# Patient Record
Sex: Male | Born: 1985 | Race: Black or African American | Hispanic: No | Marital: Single | Smoking: Former smoker
Health system: Southern US, Community
[De-identification: ages and names within clinical notes are randomized; demographics above are authoritative.]

## PROBLEM LIST (undated history)

## (undated) DIAGNOSIS — I10 Essential (primary) hypertension: Secondary | ICD-10-CM

## (undated) HISTORY — PX: TESTICLE SURGERY: SHX794

---

## 2011-11-20 ENCOUNTER — Emergency Department (HOSPITAL_BASED_OUTPATIENT_CLINIC_OR_DEPARTMENT_OTHER)
Admission: EM | Admit: 2011-11-20 | Discharge: 2011-11-20 | Disposition: A | Discharge status: Home / Self Care | Payer: No Typology Code available for payment source | Attending: Emergency Medicine | Admitting: Emergency Medicine

## 2011-11-20 ENCOUNTER — Encounter (HOSPITAL_BASED_OUTPATIENT_CLINIC_OR_DEPARTMENT_OTHER): Payer: Self-pay | Admitting: *Deleted

## 2011-11-20 DIAGNOSIS — S93409A Sprain of unspecified ligament of unspecified ankle, initial encounter: Secondary | ICD-10-CM

## 2011-11-20 DIAGNOSIS — M549 Dorsalgia, unspecified: Secondary | ICD-10-CM | POA: Insufficient documentation

## 2011-11-20 DIAGNOSIS — F17200 Nicotine dependence, unspecified, uncomplicated: Secondary | ICD-10-CM | POA: Insufficient documentation

## 2011-11-20 DIAGNOSIS — R51 Headache: Secondary | ICD-10-CM | POA: Insufficient documentation

## 2011-11-20 LAB — URINALYSIS, ROUTINE W REFLEX MICROSCOPIC
Glucose, UA: NEGATIVE mg/dL
Ketones, ur: NEGATIVE mg/dL
Nitrite: NEGATIVE
Protein, ur: NEGATIVE mg/dL
Urobilinogen, UA: 0.2 mg/dL (ref 0.0–1.0)

## 2011-11-20 LAB — URINE MICROSCOPIC-ADD ON

## 2011-11-20 MED ORDER — CYCLOBENZAPRINE HCL 10 MG PO TABS: 10.0000 mg | ORAL_TABLET | Freq: Three times a day (TID) | ORAL | Status: AC | PRN

## 2011-11-20 MED ORDER — OXYCODONE-ACETAMINOPHEN 5-325 MG PO TABS: 1.0000 | ORAL_TABLET | Freq: Four times a day (QID) | ORAL | Status: DC | PRN

## 2011-11-20 NOTE — ED Notes (Signed)
Pt states he is out of percocets
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
## 2011-11-21 NOTE — ED Provider Notes (Signed)
I saw and evaluated the patient, reviewed the resident's note and I agree with the findings and plan.   .Face to face Exam:  General:  Awake HEENT:  Atraumatic Resp:  Normal effort Abd:  Nondistended Neuro:No focal weakness Lymph: No adenopathy   Nelia Shi, MD 11/21/11 1115

## 2011-11-22 LAB — URINE CULTURE
Colony Count: NO GROWTH
Culture: NO GROWTH

## 2011-11-26 ENCOUNTER — Emergency Department (HOSPITAL_BASED_OUTPATIENT_CLINIC_OR_DEPARTMENT_OTHER)
Admission: EM | Admit: 2011-11-26 | Discharge: 2011-11-26 | Disposition: A | Discharge status: Home / Self Care | Payer: No Typology Code available for payment source | Attending: Emergency Medicine | Admitting: Emergency Medicine

## 2011-11-26 ENCOUNTER — Encounter (HOSPITAL_BASED_OUTPATIENT_CLINIC_OR_DEPARTMENT_OTHER): Payer: Self-pay

## 2011-11-26 ENCOUNTER — Emergency Department (HOSPITAL_BASED_OUTPATIENT_CLINIC_OR_DEPARTMENT_OTHER): Payer: No Typology Code available for payment source

## 2011-11-26 DIAGNOSIS — M25572 Pain in left ankle and joints of left foot: Secondary | ICD-10-CM

## 2011-11-26 DIAGNOSIS — M25579 Pain in unspecified ankle and joints of unspecified foot: Secondary | ICD-10-CM | POA: Insufficient documentation

## 2011-11-26 DIAGNOSIS — F17200 Nicotine dependence, unspecified, uncomplicated: Secondary | ICD-10-CM | POA: Insufficient documentation

## 2011-11-26 MED ORDER — IBUPROFEN 800 MG PO TABS: 800.0000 mg | ORAL_TABLET | Freq: Three times a day (TID) | ORAL | Status: AC
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
## 2011-11-26 NOTE — ED Notes (Signed)
Patient transported to X-ray 

## 2011-11-29 NOTE — ED Provider Notes (Signed)
History/physical exam/procedure(s) were performed by non-physician practitioner and as supervising physician I was immediately available for consultation/collaboration. I have reviewed all notes and am in agreement with care and plan.   Hilario Quarry, MD 11/29/11 [phone]

## 2012-01-17 ENCOUNTER — Emergency Department (HOSPITAL_BASED_OUTPATIENT_CLINIC_OR_DEPARTMENT_OTHER)
Admission: EM | Admit: 2012-01-17 | Discharge: 2012-01-17 | Disposition: A | Discharge status: Home / Self Care | Payer: Self-pay | Attending: Emergency Medicine | Admitting: Emergency Medicine

## 2012-01-17 ENCOUNTER — Encounter (HOSPITAL_BASED_OUTPATIENT_CLINIC_OR_DEPARTMENT_OTHER): Payer: Self-pay | Admitting: Emergency Medicine

## 2012-01-17 DIAGNOSIS — F17200 Nicotine dependence, unspecified, uncomplicated: Secondary | ICD-10-CM | POA: Insufficient documentation

## 2012-01-17 DIAGNOSIS — X58XXXA Exposure to other specified factors, initial encounter: Secondary | ICD-10-CM | POA: Insufficient documentation

## 2012-01-17 DIAGNOSIS — S01511A Laceration without foreign body of lip, initial encounter: Secondary | ICD-10-CM

## 2012-01-17 DIAGNOSIS — S01501A Unspecified open wound of lip, initial encounter: Secondary | ICD-10-CM | POA: Insufficient documentation

## 2012-01-17 DIAGNOSIS — Y92009 Unspecified place in unspecified non-institutional (private) residence as the place of occurrence of the external cause: Secondary | ICD-10-CM | POA: Insufficient documentation

## 2012-01-17 MED ORDER — AMOXICILLIN 500 MG PO CAPS: 500.0000 mg | ORAL_CAPSULE | Freq: Three times a day (TID) | ORAL | Status: AC

## 2012-01-17 MED ORDER — LIDOCAINE HCL 2 % IJ SOLN
INTRAMUSCULAR | Status: AC
  Administered 2012-01-17: 400 mg via INTRADERMAL
  Filled 2012-01-17: qty 20

## 2012-01-17 MED ORDER — LIDOCAINE HCL 2 % IJ SOLN
20.0000 mL | Freq: Once | INTRAMUSCULAR | Status: AC
  Administered 2012-01-17: 400 mg via INTRADERMAL
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
## 2012-01-17 NOTE — ED Notes (Signed)
States bit his lip while playing with dog

## 2012-01-18 NOTE — ED Provider Notes (Signed)
Medical screening examination/treatment/procedure(s) were performed by non-physician practitioner and as supervising physician I was immediately available for consultation/collaboration.   Carleene Cooper III, MD 01/18/12 1140

## 2012-07-20 ENCOUNTER — Encounter (HOSPITAL_BASED_OUTPATIENT_CLINIC_OR_DEPARTMENT_OTHER): Payer: Self-pay | Admitting: Family Medicine

## 2012-07-20 ENCOUNTER — Emergency Department (HOSPITAL_BASED_OUTPATIENT_CLINIC_OR_DEPARTMENT_OTHER)
Admission: EM | Admit: 2012-07-20 | Discharge: 2012-07-20 | Disposition: A | Discharge status: Home / Self Care | Payer: No Typology Code available for payment source | Attending: Emergency Medicine | Admitting: Emergency Medicine

## 2012-07-20 DIAGNOSIS — S60511A Abrasion of right hand, initial encounter: Secondary | ICD-10-CM

## 2012-07-20 DIAGNOSIS — M545 Low back pain, unspecified: Secondary | ICD-10-CM

## 2012-07-20 DIAGNOSIS — Y92410 Unspecified street and highway as the place of occurrence of the external cause: Secondary | ICD-10-CM | POA: Insufficient documentation

## 2012-07-20 DIAGNOSIS — S61409A Unspecified open wound of unspecified hand, initial encounter: Secondary | ICD-10-CM | POA: Insufficient documentation

## 2012-07-20 DIAGNOSIS — W261XXA Contact with sword or dagger, initial encounter: Secondary | ICD-10-CM | POA: Insufficient documentation

## 2012-07-20 DIAGNOSIS — IMO0002 Reserved for concepts with insufficient information to code with codable children: Secondary | ICD-10-CM | POA: Insufficient documentation

## 2012-07-20 DIAGNOSIS — S61412A Laceration without foreign body of left hand, initial encounter: Secondary | ICD-10-CM

## 2012-07-20 DIAGNOSIS — F17200 Nicotine dependence, unspecified, uncomplicated: Secondary | ICD-10-CM | POA: Insufficient documentation

## 2012-07-20 DIAGNOSIS — Y9389 Activity, other specified: Secondary | ICD-10-CM | POA: Insufficient documentation

## 2012-07-20 DIAGNOSIS — W260XXA Contact with knife, initial encounter: Secondary | ICD-10-CM | POA: Insufficient documentation

## 2012-07-20 MED ORDER — CEPHALEXIN 500 MG PO CAPS: 500.0000 mg | ORAL_CAPSULE | Freq: Four times a day (QID) | ORAL | Status: AC
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
## 2012-07-21 NOTE — ED Provider Notes (Signed)
Medical screening examination/treatment/procedure(s) were performed by non-physician practitioner and as supervising physician I was immediately available for consultation/collaboration.   Dione Booze, MD 07/21/12 0700

## 2013-02-03 ENCOUNTER — Emergency Department (HOSPITAL_BASED_OUTPATIENT_CLINIC_OR_DEPARTMENT_OTHER)
Admission: EM | Admit: 2013-02-03 | Discharge: 2013-02-04 | Disposition: A | Discharge status: Home / Self Care | Payer: No Typology Code available for payment source | Attending: Emergency Medicine | Admitting: Emergency Medicine

## 2013-02-03 ENCOUNTER — Encounter (HOSPITAL_BASED_OUTPATIENT_CLINIC_OR_DEPARTMENT_OTHER): Payer: Self-pay | Admitting: Emergency Medicine

## 2013-02-03 DIAGNOSIS — F17200 Nicotine dependence, unspecified, uncomplicated: Secondary | ICD-10-CM | POA: Insufficient documentation

## 2013-02-03 DIAGNOSIS — N342 Other urethritis: Secondary | ICD-10-CM | POA: Insufficient documentation

## 2013-02-03 DIAGNOSIS — I10 Essential (primary) hypertension: Secondary | ICD-10-CM | POA: Insufficient documentation

## 2013-02-03 DIAGNOSIS — Z792 Long term (current) use of antibiotics: Secondary | ICD-10-CM | POA: Insufficient documentation

## 2013-02-03 HISTORY — DX: Essential (primary) hypertension: I10
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
## 2013-02-04 MED ORDER — CEFTRIAXONE SODIUM 250 MG IJ SOLR
250.0000 mg | Freq: Once | INTRAMUSCULAR | Status: AC
  Administered 2013-02-04: 250 mg via INTRAMUSCULAR
  Filled 2013-02-04: qty 250

## 2013-02-04 MED ORDER — IBUPROFEN 600 MG PO TABS: 600.0000 mg | ORAL_TABLET | Freq: Four times a day (QID) | ORAL | Status: AC | PRN

## 2013-02-04 MED ORDER — AZITHROMYCIN 1 G PO PACK
1.0000 g | PACK | Freq: Once | ORAL | Status: AC
  Administered 2013-02-04: 1 g via ORAL
  Filled 2013-02-04: qty 1

## 2013-02-04 MED ORDER — METRONIDAZOLE 500 MG PO TABS
2000.0000 mg | ORAL_TABLET | Freq: Once | ORAL | Status: AC
  Administered 2013-02-04: 2000 mg via ORAL
  Filled 2013-02-04: qty 4

## 2013-02-05 LAB — GC/CHLAMYDIA PROBE AMP
CT Probe RNA: NEGATIVE
GC Probe RNA: POSITIVE — AB

## 2013-02-06 ENCOUNTER — Telehealth (HOSPITAL_COMMUNITY): Payer: Self-pay | Admitting: Emergency Medicine

## 2013-02-06 NOTE — ED Notes (Signed)
+  Gonorrhea. Patient treated with Rocephin and Zithromax. [employer] faxed. 

## 2013-02-06 NOTE — ED Notes (Signed)
Patient has +Gonorrhea. °

## 2013-02-07 ENCOUNTER — Telehealth (HOSPITAL_COMMUNITY): Payer: Self-pay | Admitting: Emergency Medicine
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
## 2021-12-25 ENCOUNTER — Encounter (HOSPITAL_BASED_OUTPATIENT_CLINIC_OR_DEPARTMENT_OTHER): Payer: Self-pay | Admitting: Emergency Medicine

## 2021-12-25 ENCOUNTER — Other Ambulatory Visit: Payer: Self-pay

## 2021-12-25 ENCOUNTER — Emergency Department (HOSPITAL_BASED_OUTPATIENT_CLINIC_OR_DEPARTMENT_OTHER)
Admission: EM | Admit: 2021-12-25 | Discharge: 2021-12-25 | Disposition: A | Discharge status: Home / Self Care | Payer: Self-pay | Attending: Emergency Medicine | Admitting: Emergency Medicine

## 2021-12-25 DIAGNOSIS — Z5321 Procedure and treatment not carried out due to patient leaving prior to being seen by health care provider: Secondary | ICD-10-CM | POA: Insufficient documentation

## 2021-12-25 DIAGNOSIS — I10 Essential (primary) hypertension: Secondary | ICD-10-CM | POA: Insufficient documentation

## 2021-12-25 NOTE — ED Triage Notes (Signed)
Patient arrived via POV c/o hypertension with systolic over 200 at home, 30 min pta. Patient states headache at this time. Patient states 10/10 pain. Patient is AO x 4, VS w/ elevated BP, normal gait.
